# Patient Record
Sex: Male | Born: 1980 | Race: Black or African American | Hispanic: No | Marital: Married | Smoking: Never smoker
Health system: Southern US, Community
[De-identification: ages and names within clinical notes are randomized; demographics above are authoritative.]

## PROBLEM LIST (undated history)

## (undated) DIAGNOSIS — I10 Essential (primary) hypertension: Secondary | ICD-10-CM

---

## 2017-12-01 ENCOUNTER — Encounter (HOSPITAL_BASED_OUTPATIENT_CLINIC_OR_DEPARTMENT_OTHER): Payer: Self-pay | Admitting: Emergency Medicine

## 2017-12-01 ENCOUNTER — Other Ambulatory Visit: Payer: Self-pay

## 2017-12-01 ENCOUNTER — Emergency Department (HOSPITAL_BASED_OUTPATIENT_CLINIC_OR_DEPARTMENT_OTHER)
Admission: EM | Admit: 2017-12-01 | Discharge: 2017-12-01 | Disposition: A | Discharge status: Home / Self Care | Payer: BLUE CROSS/BLUE SHIELD | Attending: Emergency Medicine | Admitting: Emergency Medicine

## 2017-12-01 ENCOUNTER — Emergency Department (HOSPITAL_BASED_OUTPATIENT_CLINIC_OR_DEPARTMENT_OTHER): Payer: BLUE CROSS/BLUE SHIELD

## 2017-12-01 DIAGNOSIS — X500XXA Overexertion from strenuous movement or load, initial encounter: Secondary | ICD-10-CM | POA: Diagnosis not present

## 2017-12-01 DIAGNOSIS — I10 Essential (primary) hypertension: Secondary | ICD-10-CM | POA: Diagnosis not present

## 2017-12-01 DIAGNOSIS — Y999 Unspecified external cause status: Secondary | ICD-10-CM | POA: Insufficient documentation

## 2017-12-01 DIAGNOSIS — Y92019 Unspecified place in single-family (private) house as the place of occurrence of the external cause: Secondary | ICD-10-CM | POA: Insufficient documentation

## 2017-12-01 DIAGNOSIS — Y93E6 Activity, residential relocation: Secondary | ICD-10-CM | POA: Insufficient documentation

## 2017-12-01 DIAGNOSIS — S86011A Strain of right Achilles tendon, initial encounter: Secondary | ICD-10-CM | POA: Diagnosis not present

## 2017-12-01 DIAGNOSIS — S99921A Unspecified injury of right foot, initial encounter: Secondary | ICD-10-CM | POA: Diagnosis present

## 2017-12-01 HISTORY — DX: Essential (primary) hypertension: I10

## 2017-12-01 MED ORDER — NAPROXEN 250 MG PO TABS
500.0000 mg | ORAL_TABLET | Freq: Once | ORAL | Status: AC
  Administered 2017-12-01: 500 mg via ORAL
  Filled 2017-12-01: qty 2

## 2017-12-01 MED ORDER — NAPROXEN 500 MG PO TABS: 500.0000 mg | ORAL_TABLET | Freq: Two times a day (BID) | ORAL | 0 refills | Status: AC

## 2017-12-01 NOTE — ED Triage Notes (Signed)
Patient states that he was moving yesterday and he has pain to the back of his foot today worse when he stands on it

## 2017-12-01 NOTE — ED Provider Notes (Signed)
[facility] EMERGENCY DEPARTMENT Provider Note   CSN: [id number] Arrival date & time: 12/01/17  1141     History   Chief Complaint Chief Complaint  Patient presents with  . Foot Pain    HPI Steven Cantu is a 37 y.o. male who presents to ED for evaluation of right Achilles tendon pain that began yesterday.  He states that he and his family are moving so they were doing a lot of walking and heavy lifting yesterday.  Pain is worse with weightbearing.  He also reports pain with palpation.  No previous history of similar symptoms.  Denies any injury or falls.  He is not taking any medications to help with symptoms prior to arrival.  No previous fracture, dislocations or procedures in the area.  He denies any numbness to the leg, fever, red hot or tender joint or history of blood clots.  HPI  Past Medical History:  Diagnosis Date  . Hypertension     There are no active problems to display for this patient.   History reviewed. No pertinent surgical history.     Home Medications    Prior to Admission medications   Medication Sig Start Date End Date Taking? Authorizing Provider  naproxen (NAPROSYN) 500 MG tablet Take 1 tablet (500 mg total) by mouth 2 (two) times daily. 12/01/17   Dietrich PatesKhatri, Jaidan Stachnik, PA-C    Family History History reviewed. No pertinent family history.  Social History Social History   Tobacco Use  . Smoking status: Never Smoker  . Smokeless tobacco: Never Used  Substance Use Topics  . Alcohol use: No    Frequency: Never  . Drug use: No     Allergies   Patient has no known allergies.   Review of Systems Review of Systems  Constitutional: Negative for chills and fever.  Gastrointestinal: Negative for nausea and vomiting.  Musculoskeletal: Positive for gait problem and myalgias. Negative for arthralgias, joint swelling, neck pain and neck stiffness.  Skin: Negative for color change and rash.  Neurological: Negative for weakness and numbness.       Physical Exam Updated Vital Signs BP (!) 159/88 (BP Location: Right Arm)   Pulse 82   Temp 98.9 F (37.2 C) (Oral)   Resp 16   SpO2 100%   Physical Exam  Constitutional: He appears well-developed and well-nourished. No distress.  HENT:  Head: Normocephalic and atraumatic.  Eyes: Conjunctivae and EOM are normal. No scleral icterus.  Neck: Normal range of motion.  Pulmonary/Chest: Effort normal. No respiratory distress.  Musculoskeletal: Normal range of motion. He exhibits tenderness. He exhibits no edema or deformity.  Tenderness to palpation at the posterior ankle at the Achilles tendon site.  No bruising, color or temperature change noted.  Negative Thompson's test.  No calf tenderness noted.  Full active and passive range of motion of ankle and digits.  2+ DP pulse noted bilaterally.  Sensation intact to light touch of bilateral lower extremities.  Neurological: He is alert.  Skin: No rash noted. He is not diaphoretic.  Psychiatric: He has a normal mood and affect.  Nursing note and vitals reviewed.    ED Treatments / Results  Labs (all labs ordered are listed, but only abnormal results are displayed) Labs Reviewed - No data to display  EKG  EKG Interpretation None       Radiology Dg Foot Complete Right  Result Date: 12/01/2017 CLINICAL DATA:  Pain following fall EXAM: RIGHT FOOT COMPLETE - 3+ VIEW COMPARISON:  None.  FINDINGS: Frontal, oblique, and lateral views were obtained. There is no fracture or dislocation. Joint spaces appear normal. No erosive change. No radiopaque foreign body. IMPRESSION: No fracture or dislocation.  No evident arthropathy. Electronically Signed   By: Bretta Bang III M.D.   On: 12/01/2017 12:10    Procedures Procedures (including critical care time)  Medications Ordered in ED Medications  naproxen (NAPROSYN) tablet 500 mg (not administered)     Initial Impression / Assessment and Plan / ED Course  I have reviewed the  triage vital signs and the nursing notes.  Pertinent labs & imaging results that were available during my care of the patient were reviewed by me and considered in my medical decision making (see chart for details).     Patient presents to ED for evaluation of right posterior ankle pain for the past day.  Area of pain is in the Achilles tendon area.  No bruising or signs of ruptured noted.  There is a negative Thompson's test however pain with palpation of the area.  No calf tenderness, history of blood clots that would concern me for a DVT.  Full active and passive range of motion of ankle and digits and CMS is intact.  I suspect that this is related to his overuse yesterday.  Will give short course of NSAIDs, Ace wrap and advised him to rest and elevate the extremity as tolerated.  Advised to follow-up with orthopedics if symptoms persist.  Patient appears stable for discharge at this time.  Strict return precautions given.  Final Clinical Impressions(s) / ED Diagnoses   Final diagnoses:  Strain of right Achilles tendon, initial encounter    ED Discharge Orders        Ordered    naproxen (NAPROSYN) 500 MG tablet  2 times daily     12/01/17 1341     Portions of this note were generated with Dragon dictation software. Dictation errors may occur despite best attempts at proofreading.    Dietrich Pates, PA-C 12/01/17 1346    Pricilla Loveless, MD 12/01/17 [phone]

## 2017-12-01 NOTE — Discharge Instructions (Signed)
Please read attached information regarding your condition. Take naproxen twice daily to help with pain for the next 5-7 days. Wear Ace wrap as directed. Rice and elevate the extremity as directed. Follow-up with orthopedic specialist listed below for further evaluation if symptoms persist. Return to ED for worsening symptoms, numbness in legs, red hot or tender joint, falls or injuries.

## 2019-11-01 IMAGING — DX DG FOOT COMPLETE 3+V*R*
3 series · 3 of 3 positions shown · non-contrast
Comparison: None.

CLINICAL DATA: Pain following fall

EXAM:
RIGHT FOOT COMPLETE - 3+ VIEW

[foot ap]
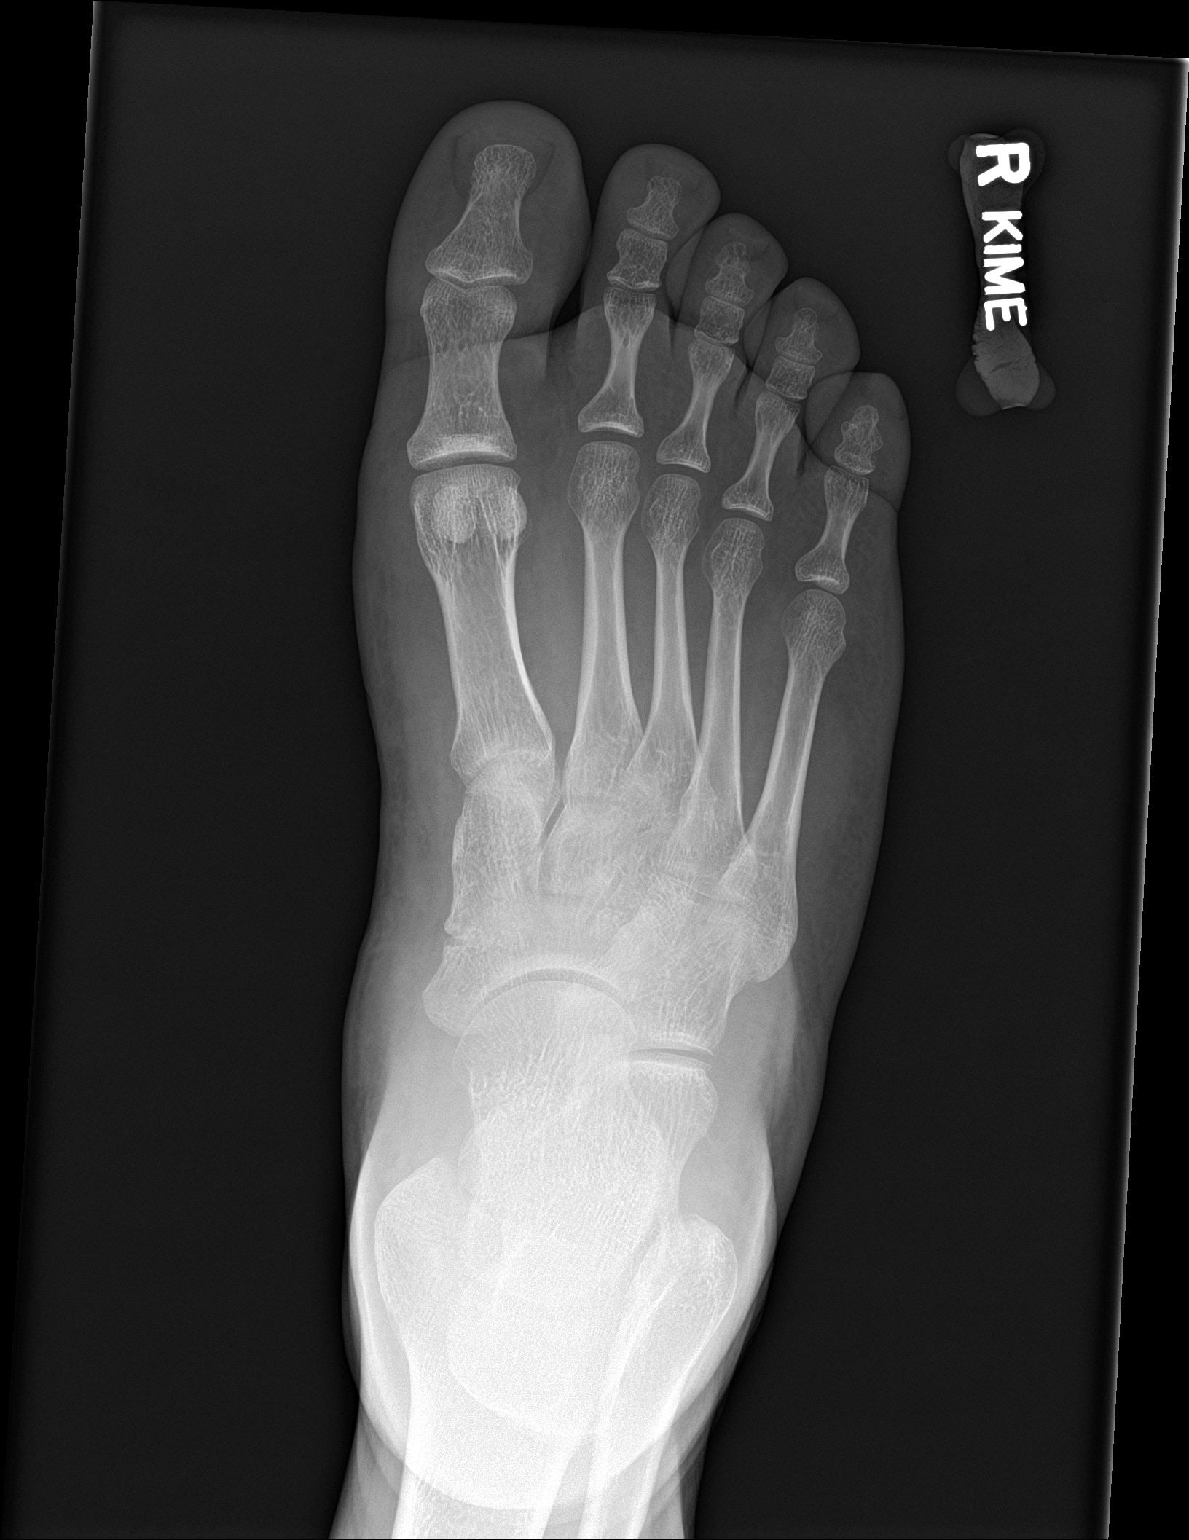

[foot obl]
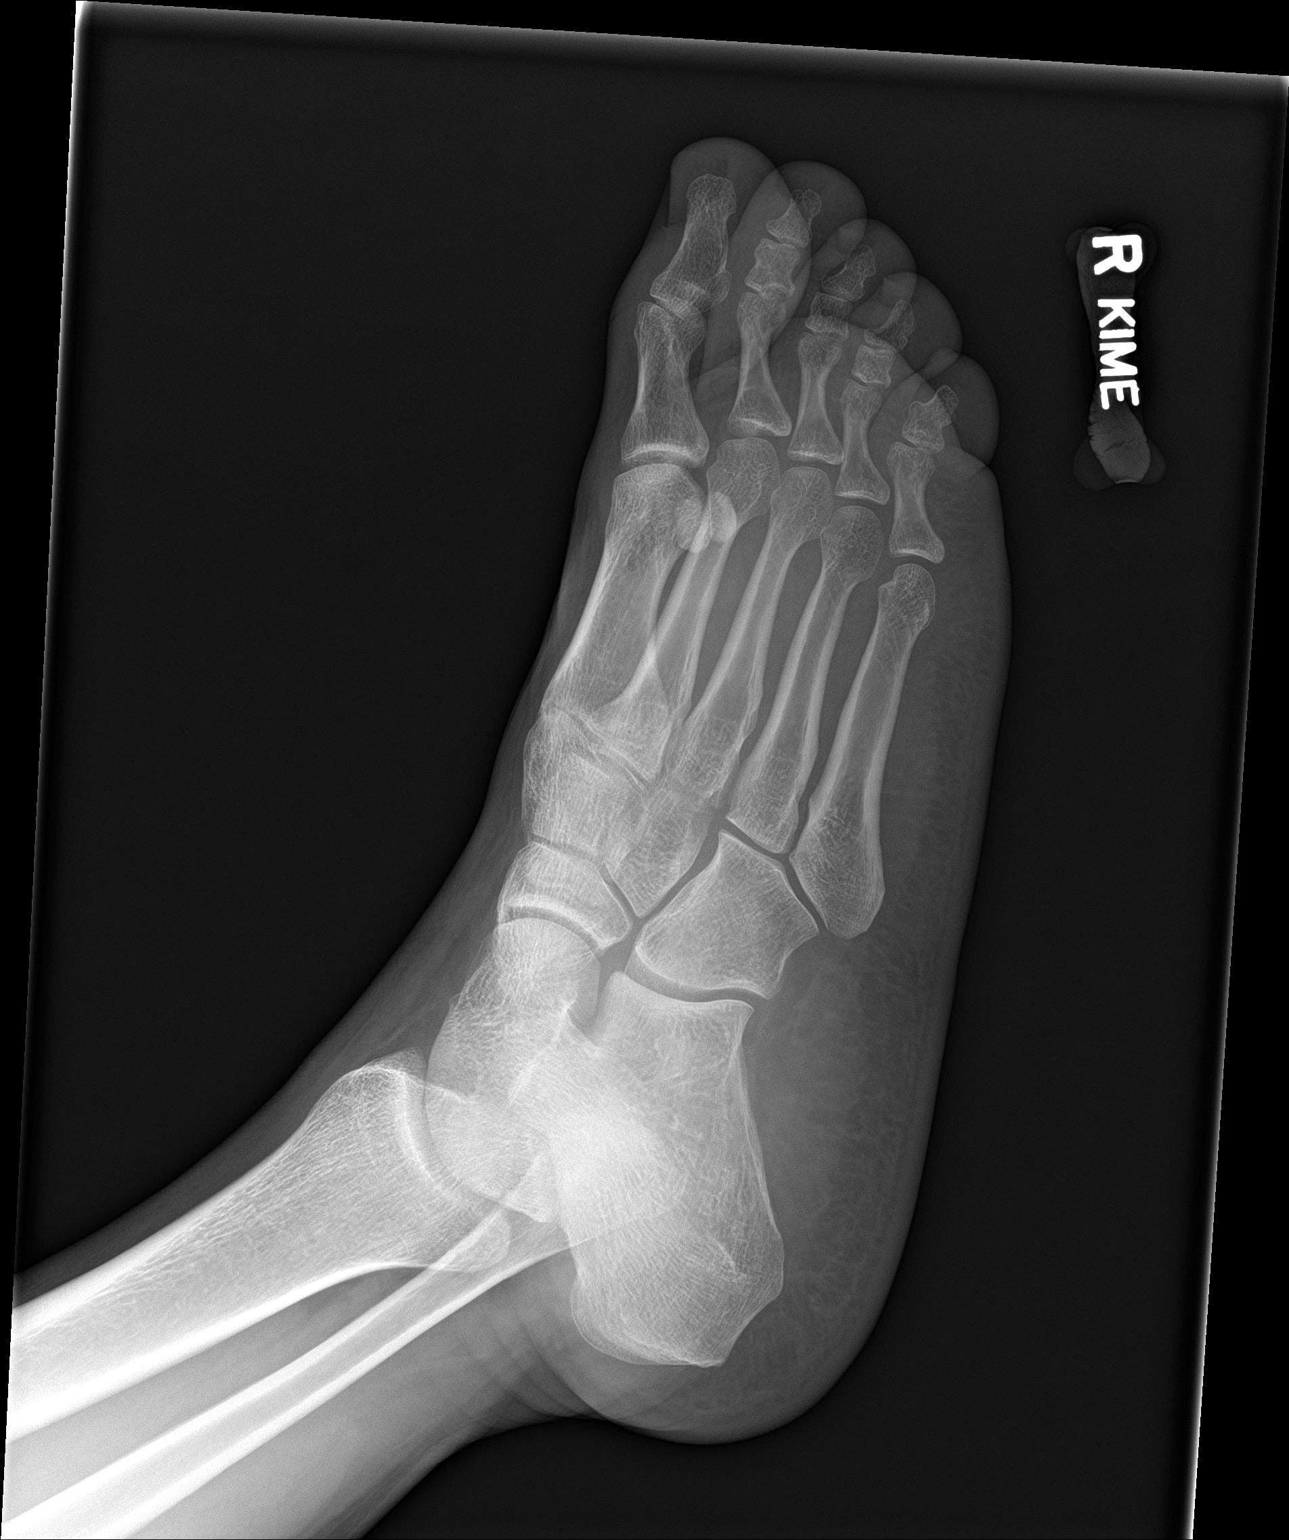

[foot lat]
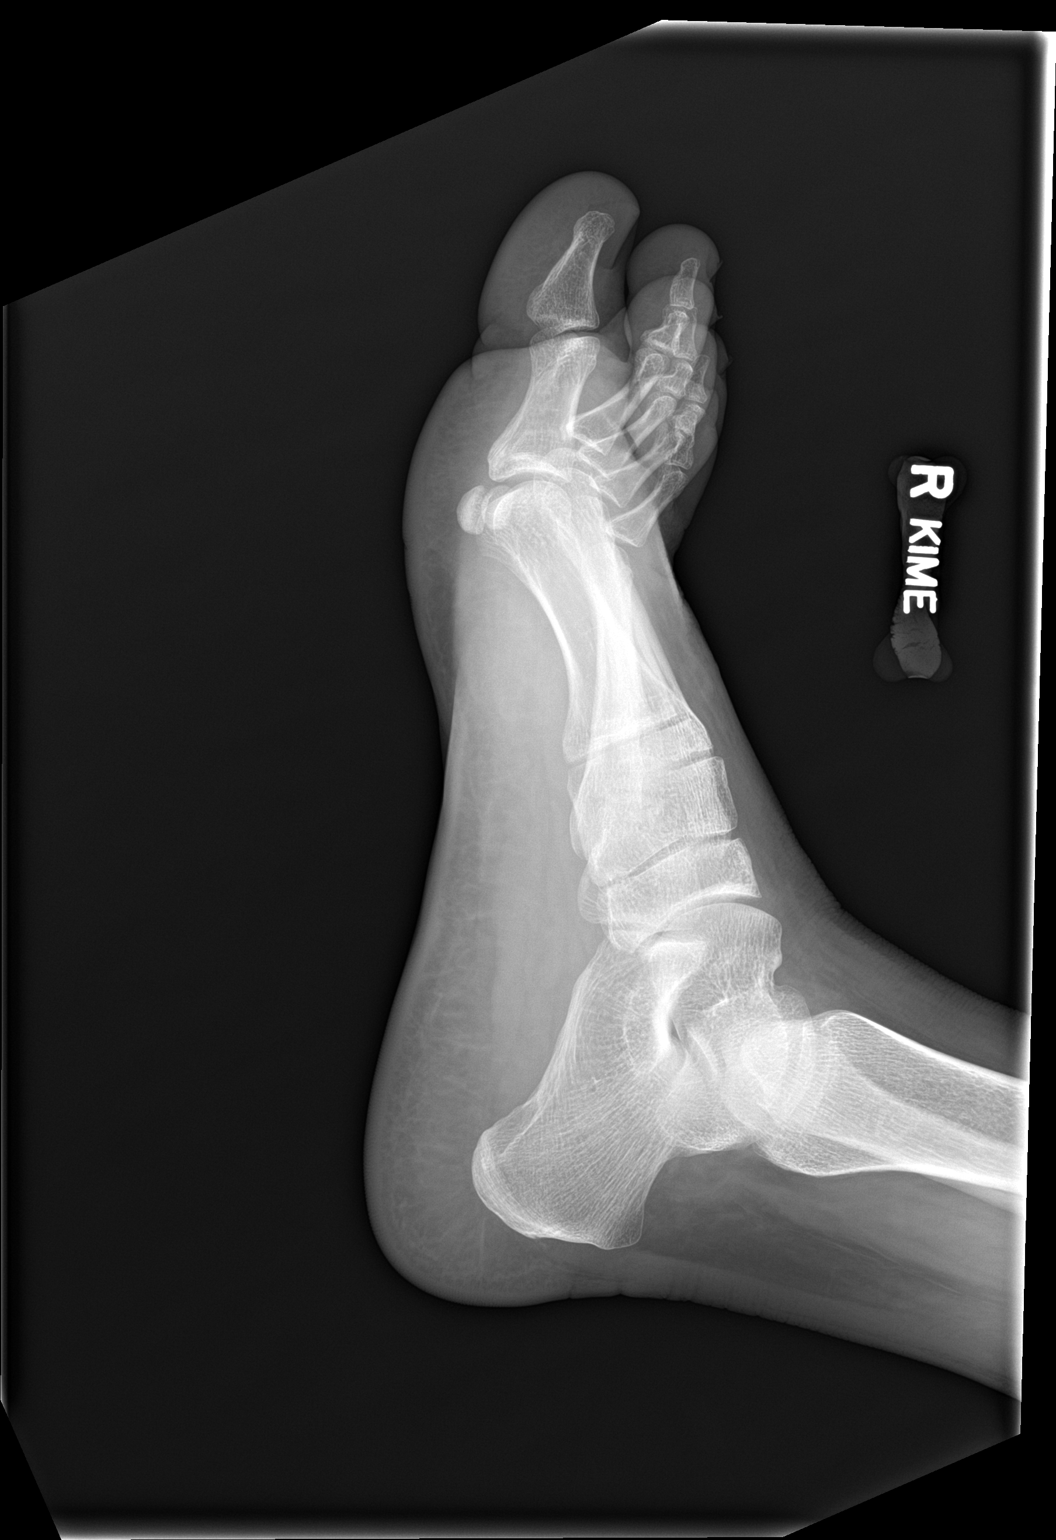

[3 of 3 positions shown; findings below may reference images not displayed]

FINDINGS: Frontal, oblique, and lateral views were obtained. There is no
fracture or dislocation. Joint spaces appear normal. No erosive
change. No radiopaque foreign body.
IMPRESSION: No fracture or dislocation.  No evident arthropathy.

## 2023-08-23 ENCOUNTER — Encounter (HOSPITAL_BASED_OUTPATIENT_CLINIC_OR_DEPARTMENT_OTHER): Payer: Self-pay | Admitting: Pediatrics

## 2023-08-23 ENCOUNTER — Emergency Department (HOSPITAL_BASED_OUTPATIENT_CLINIC_OR_DEPARTMENT_OTHER)
Admission: EM | Admit: 2023-08-23 | Discharge: 2023-08-23 | Disposition: A | Discharge status: Home / Self Care | Payer: BC Managed Care – PPO | Attending: Emergency Medicine | Admitting: Emergency Medicine

## 2023-08-23 ENCOUNTER — Other Ambulatory Visit: Payer: Self-pay

## 2023-08-23 DIAGNOSIS — B3742 Candidal balanitis: Secondary | ICD-10-CM | POA: Insufficient documentation

## 2023-08-23 DIAGNOSIS — E13628 Other specified diabetes mellitus with other skin complications: Secondary | ICD-10-CM | POA: Insufficient documentation

## 2023-08-23 DIAGNOSIS — R000 Tachycardia, unspecified: Secondary | ICD-10-CM | POA: Insufficient documentation

## 2023-08-23 DIAGNOSIS — R21 Rash and other nonspecific skin eruption: Secondary | ICD-10-CM | POA: Diagnosis present

## 2023-08-23 LAB — URINALYSIS, MICROSCOPIC (REFLEX)

## 2023-08-23 LAB — URINALYSIS, ROUTINE W REFLEX MICROSCOPIC
Bilirubin Urine: NEGATIVE
Glucose, UA: >=500 mg/dL — AB
Hgb urine dipstick: NEGATIVE
Ketones, ur: NEGATIVE mg/dL
Leukocytes,Ua: NEGATIVE
Nitrite: NEGATIVE
Protein, ur: NEGATIVE mg/dL
Specific Gravity, Urine: >=1.03 (ref 1.005–1.030)
pH: 5.5 (ref 5.0–8.0)

## 2023-08-23 LAB — CBG MONITORING, ED: Glucose-Capillary: 256 mg/dL — ABNORMAL HIGH (ref 70–99)

## 2023-08-23 MED ORDER — NYSTATIN 100000 UNIT/GM EX POWD: 1.0000 | Freq: Three times a day (TID) | CUTANEOUS | 0 refills | Status: AC

## 2023-08-23 MED ORDER — METFORMIN HCL 500 MG PO TABS: 500.0000 mg | ORAL_TABLET | Freq: Two times a day (BID) | ORAL | 0 refills | Status: AC

## 2023-08-23 MED ORDER — FLUCONAZOLE 150 MG PO TABS
150.0000 mg | ORAL_TABLET | Freq: Once | ORAL | Status: AC
  Administered 2023-08-23: 150 mg via ORAL
  Filled 2023-08-23: qty 1

## 2023-08-23 NOTE — Discharge Instructions (Signed)
You have been evaluated for your symptoms.  Your penile discomfort is due to yeast infection involving the penile gland.  Please apply nystatin cream up to 3 times daily to the affected area for the next week as treatment.  Your blood sugar is elevated today consistent with diabetes.  Start taking metformin 500 mg twice daily and follow-up closely with your doctor for further managements of your diabetes.

## 2023-08-23 NOTE — ED Provider Notes (Signed)
[place] [facility]ERGENCY DEPARTMENT AT [facility] Provider Note   CSN: [id number] Arrival date & time: 08/23/23  1735     History  Chief Complaint  Patient presents with   Dysuria    Steven Cantu is a 42 y.o. male.  The history is provided by the patient.  Dysuria Presenting symptoms: dysuria      42 year old male with history of hypertension, complaining of penile discomfort.  Patient report for nearly 2 weeks he has noticed a rash to the tip of his penis.  Rash is painful whenever his urine touches it but he denies any urinary frequency or urgency or burning on urination unless it touches the rash.  He has never had this rash before.  He tries using Vaseline on the rash without relief.  He does not endorse any fever chills denies polyuria or polydipsia denies any abdominal pain or back pain and denies any new sexual partner.  Denies any change in his environment or body product.  Home Medications Prior to Admission medications   Medication Sig Start Date End Date Taking? Authorizing Provider  naproxen (NAPROSYN) 500 MG tablet Take 1 tablet (500 mg total) by mouth 2 (two) times daily. 12/01/17   Dietrich Pates, PA-C      Allergies    Patient has no known allergies.    Review of Systems   Review of Systems  Genitourinary:  Positive for dysuria.  All other systems reviewed and are negative.   Physical Exam Updated Vital Signs BP (!) 164/122 (BP Location: Left Arm)   Pulse (!) 110   Temp 98.6 F (37 C) (Oral)   Resp 15   Ht 5\' 4"  (1.626 m)   Wt 104.3 kg   SpO2 95%   BMI 39.48 kg/m  Physical Exam Vitals and nursing note reviewed.  Constitutional:      General: He is not in acute distress.    Appearance: He is well-developed.  HENT:     Head: Atraumatic.  Eyes:     Conjunctiva/sclera: Conjunctivae normal.  Cardiovascular:     Rate and Rhythm: Tachycardia present.     Pulses: Normal pulses.     Heart sounds: Normal heart sounds.  Pulmonary:      Effort: Pulmonary effort is normal.     Breath sounds: Normal breath sounds. No wheezing, rhonchi or rales.  Abdominal:     Palpations: Abdomen is soft.     Tenderness: There is no abdominal tenderness.  Musculoskeletal:     Cervical back: Neck supple.  Skin:    Findings: No rash.  Neurological:     Mental Status: He is alert.     ED Results / Procedures / Treatments   Labs (all labs ordered are listed, but only abnormal results are displayed) Labs Reviewed  URINALYSIS, ROUTINE W REFLEX MICROSCOPIC - Abnormal; Notable for the following components:      Result Value   Glucose, UA >=500 (*)    All other components within normal limits  URINALYSIS, MICROSCOPIC (REFLEX) - Abnormal; Notable for the following components:   Bacteria, UA RARE (*)    All other components within normal limits  CBG MONITORING, ED - Abnormal; Notable for the following components:   Glucose-Capillary 256 (*)    All other components within normal limits    EKG None  Radiology No results found.  Procedures Procedures    Medications Ordered in ED Medications  fluconazole (DIFLUCAN) tablet 150 mg (150 mg Oral Given 08/23/23 1933)  ED Course/ Medical Decision Making/ A&P                                 Medical Decision Making Amount and/or Complexity of Data Reviewed Labs: ordered.   BP (!) 164/122 (BP Location: Left Arm)   Pulse (!) 110   Temp 98.6 F (37 C) (Oral)   Resp 15   Ht 5\' 4"  (1.626 m)   Wt 104.3 kg   SpO2 95%   BMI 39.48 kg/m   57:59 PM  42 year old male with history of hypertension, complaining of penile discomfort.  Patient report for nearly 2 weeks he has noticed a rash to the tip of his penis.  Rash is painful whenever his urine touches it but he denies any urinary frequency or urgency or burning on urination unless it touches the rash.  He has never had this rash before.  He tries using Vaseline on the rash without relief.  He does not endorse any fever chills denies  polyuria or polydipsia denies any abdominal pain or back pain and denies any new sexual partner.  Denies any change in his environment or body product.  On exam this is a well-appearing male resting comfortably in bed appears to be in no acute discomfort.  Heart with mild tachycardia, lungs clear to auscultation bilaterally abdomen is soft nontender, chaperone present during genital exam.  No inguinal lymphadenopathy or inguinal hernia noted.  Uncircumcised penis with evidence of Candida balanitis involving the gland and the foreskin of the penis.  No vesicular blisters to suggest herpes no ulcerated lesion to suggest syphilis.  Since patient has elevated blood sugar of 256, it is likely that he has diabetes but fortunately no evidence of ketones in the urine to suggest DKA.  Plan to discharge patient home with nystatin cream for his candidal balanitis.  I will give patient 1 dose of Diflucan here.  I will also prescribe patient with metformin but encouraged patient to follow-up with her PCP for further evaluation and managements of his diabetes.        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

## 2023-08-23 NOTE — ED Triage Notes (Signed)
C/O painful urination and rash at the tip of penis.
